# Patient Record
Sex: Male | Born: 1990 | Race: White | Hispanic: No | Marital: Single | Smoking: Never smoker
Health system: Southern US, Community
[De-identification: ages and names within clinical notes are randomized; demographics above are authoritative.]

## PROBLEM LIST (undated history)

## (undated) HISTORY — PX: WISDOM TOOTH EXTRACTION: SHX21

---

## 2013-08-25 ENCOUNTER — Emergency Department: Payer: Self-pay | Admitting: Emergency Medicine

## 2014-07-13 ENCOUNTER — Emergency Department: Payer: Self-pay | Admitting: Emergency Medicine

## 2015-08-10 ENCOUNTER — Emergency Department: Payer: Self-pay

## 2015-08-10 ENCOUNTER — Encounter: Payer: Self-pay | Admitting: Emergency Medicine

## 2015-08-10 ENCOUNTER — Emergency Department
Admission: EM | Admit: 2015-08-10 | Discharge: 2015-08-11 | Disposition: A | Discharge status: Other Acute Inpatient Hospital | Payer: Self-pay | Attending: Emergency Medicine | Admitting: Emergency Medicine

## 2015-08-10 DIAGNOSIS — Y9389 Activity, other specified: Secondary | ICD-10-CM | POA: Insufficient documentation

## 2015-08-10 DIAGNOSIS — S1093XA Contusion of unspecified part of neck, initial encounter: Secondary | ICD-10-CM

## 2015-08-10 DIAGNOSIS — S1083XA Contusion of other specified part of neck, initial encounter: Secondary | ICD-10-CM | POA: Insufficient documentation

## 2015-08-10 DIAGNOSIS — Y9289 Other specified places as the place of occurrence of the external cause: Secondary | ICD-10-CM | POA: Insufficient documentation

## 2015-08-10 DIAGNOSIS — S3992XA Unspecified injury of lower back, initial encounter: Secondary | ICD-10-CM | POA: Insufficient documentation

## 2015-08-10 DIAGNOSIS — S0993XA Unspecified injury of face, initial encounter: Secondary | ICD-10-CM | POA: Insufficient documentation

## 2015-08-10 DIAGNOSIS — S6992XA Unspecified injury of left wrist, hand and finger(s), initial encounter: Secondary | ICD-10-CM | POA: Insufficient documentation

## 2015-08-10 DIAGNOSIS — Y998 Other external cause status: Secondary | ICD-10-CM | POA: Insufficient documentation

## 2015-08-10 LAB — PROTIME-INR
INR: 0.96
PROTHROMBIN TIME: 13 s (ref 11.4–15.0)

## 2015-08-10 LAB — BASIC METABOLIC PANEL
Anion gap: 4 — ABNORMAL LOW (ref 5–15)
BUN: 20 mg/dL (ref 6–20)
CALCIUM: 9.3 mg/dL (ref 8.9–10.3)
CO2: 28 mmol/L (ref 22–32)
Chloride: 106 mmol/L (ref 101–111)
Creatinine, Ser: 0.93 mg/dL (ref 0.61–1.24)
GFR calc Af Amer: >60 mL/min (ref >60)
GLUCOSE: 101 mg/dL — AB (ref 65–99)
Potassium: 4.1 mmol/L (ref 3.5–5.1)
Sodium: 138 mmol/L (ref 135–145)

## 2015-08-10 LAB — CBC WITH DIFFERENTIAL/PLATELET
BASOS ABS: 0.1 10*3/uL (ref 0–0.1)
Basophils Relative: 1 %
Eosinophils Absolute: 0.1 10*3/uL (ref 0–0.7)
Eosinophils Relative: 1 %
HEMATOCRIT: 47.5 % (ref 40.0–52.0)
Hemoglobin: 16.2 g/dL (ref 13.0–18.0)
LYMPHS PCT: 16 %
Lymphs Abs: 2.1 10*3/uL (ref 1.0–3.6)
MCH: 29.5 pg (ref 26.0–34.0)
MCHC: 34.2 g/dL (ref 32.0–36.0)
MCV: 86.4 fL (ref 80.0–100.0)
Monocytes Absolute: 1 10*3/uL (ref 0.2–1.0)
Monocytes Relative: 7 %
NEUTROS ABS: 10.2 10*3/uL — AB (ref 1.4–6.5)
Neutrophils Relative %: 75 %
PLATELETS: 300 10*3/uL (ref 150–440)
RBC: 5.5 MIL/uL (ref 4.40–5.90)
RDW: 12.5 % (ref 11.5–14.5)
WBC: 13.6 10*3/uL — AB (ref 3.8–10.6)

## 2015-08-10 LAB — HEPATIC FUNCTION PANEL
ALT: 34 U/L (ref 17–63)
AST: 25 U/L (ref 15–41)
Albumin: 4.9 g/dL (ref 3.5–5.0)
Alkaline Phosphatase: 78 U/L (ref 38–126)
BILIRUBIN DIRECT: 0.1 mg/dL (ref 0.1–0.5)
BILIRUBIN INDIRECT: 1 mg/dL — AB (ref 0.3–0.9)
BILIRUBIN TOTAL: 1.1 mg/dL (ref 0.3–1.2)
Total Protein: 8.2 g/dL — ABNORMAL HIGH (ref 6.5–8.1)

## 2015-08-10 MED ORDER — IOHEXOL 300 MG/ML  SOLN
100.0000 mL | Freq: Once | INTRAMUSCULAR | Status: AC | PRN
  Administered 2015-08-10: 100 mL via INTRAVENOUS

## 2015-08-10 MED ORDER — MORPHINE SULFATE (PF) 4 MG/ML IV SOLN
4.0000 mg | Freq: Once | INTRAVENOUS | Status: AC
  Administered 2015-08-10: 4 mg via INTRAVENOUS
  Filled 2015-08-10: qty 1

## 2015-08-10 MED ORDER — FENTANYL CITRATE (PF) 100 MCG/2ML IJ SOLN
50.0000 ug | Freq: Once | INTRAMUSCULAR | Status: AC
  Administered 2015-08-10: 50 ug via INTRAVENOUS
  Filled 2015-08-10: qty 2
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
## 2015-08-11 MED ORDER — FENTANYL CITRATE (PF) 100 MCG/2ML IJ SOLN
INTRAMUSCULAR | Status: AC
  Administered 2015-08-11: 50 ug via INTRAVENOUS
  Filled 2015-08-11: qty 2

## 2015-08-11 MED ORDER — FENTANYL CITRATE (PF) 100 MCG/2ML IJ SOLN
50.0000 ug | Freq: Once | INTRAMUSCULAR | Status: AC
  Administered 2015-08-11: 50 ug via INTRAVENOUS
  Filled 2015-08-11: qty 2

## 2015-08-11 NOTE — ED Notes (Signed)
Pt provided with ice chips per verbal order of Dr. Alphonzo LemmingsMcShane.

## 2015-08-16 ENCOUNTER — Emergency Department: Payer: Self-pay

## 2015-08-16 ENCOUNTER — Encounter: Payer: Self-pay | Admitting: *Deleted

## 2015-08-16 ENCOUNTER — Emergency Department
Admission: EM | Admit: 2015-08-16 | Discharge: 2015-08-16 | Disposition: A | Discharge status: Home / Self Care | Payer: Self-pay | Attending: Student | Admitting: Student

## 2015-08-16 DIAGNOSIS — M545 Low back pain: Secondary | ICD-10-CM | POA: Insufficient documentation

## 2015-08-16 DIAGNOSIS — S60212D Contusion of left wrist, subsequent encounter: Secondary | ICD-10-CM | POA: Insufficient documentation

## 2015-08-16 MED ORDER — HYDROCODONE-ACETAMINOPHEN 5-325 MG PO TABS: 1.0000 | ORAL_TABLET | ORAL | Status: DC | PRN

## 2015-08-16 MED ORDER — BACLOFEN 10 MG PO TABS: 10.0000 mg | ORAL_TABLET | Freq: Three times a day (TID) | ORAL | Status: DC

## 2015-08-16 MED ORDER — IBUPROFEN 800 MG PO TABS: 800.0000 mg | ORAL_TABLET | Freq: Three times a day (TID) | ORAL | Status: DC | PRN
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
## 2015-08-16 NOTE — Discharge Instructions (Signed)
Back Pain, Adult °Back pain is very common in adults. The cause of back pain is rarely dangerous and the pain often gets better over time. The cause of your back pain may not be known. Some common causes of back pain include: °· Strain of the muscles or ligaments supporting the spine. °· Wear and tear (degeneration) of the spinal disks. °· Arthritis. °· Direct injury to the back. °For many people, back pain may return. Since back pain is rarely dangerous, most people can learn to manage this condition on their own. °HOME CARE INSTRUCTIONS °Watch your back pain for any changes. The following actions may help to lessen any discomfort you are feeling: °· Remain active. It is stressful on your back to sit or stand in one place for long periods of time. Do not sit, drive, or stand in one place for more than 30 minutes at a time. Take short walks on even surfaces as soon as you are able. Try to increase the length of time you walk each day. °· Exercise regularly as directed by your health care provider. Exercise helps your back heal faster. It also helps avoid future injury by keeping your muscles strong and flexible. °· Do not stay in bed. Resting more than 1-2 days can delay your recovery. °· Pay attention to your body when you bend and lift. The most comfortable positions are those that put less stress on your recovering back. Always use proper lifting techniques, including: °· Bending your knees. °· Keeping the load close to your body. °· Avoiding twisting. °· Find a comfortable position to sleep. Use a firm mattress and lie on your side with your knees slightly bent. If you lie on your back, put a pillow under your knees. °· Avoid feeling anxious or stressed. Stress increases muscle tension and can worsen back pain. It is important to recognize when you are anxious or stressed and learn ways to manage it, such as with exercise. °· Take medicines only as directed by your health care provider. Over-the-counter  medicines to reduce pain and inflammation are often the most helpful. Your health care provider may prescribe muscle relaxant drugs. These medicines help dull your pain so you can more quickly return to your normal activities and healthy exercise. °· Apply ice to the injured area: °· Put ice in a plastic bag. °· Place a towel between your skin and the bag. °· Leave the ice on for 20 minutes, 2-3 times a day for the first 2-3 days. After that, ice and heat may be alternated to reduce pain and spasms. °· Maintain a healthy weight. Excess weight puts extra stress on your back and makes it difficult to maintain good posture. °SEEK MEDICAL CARE IF: °· You have pain that is not relieved with rest or medicine. °· You have increasing pain going down into the legs or buttocks. °· You have pain that does not improve in one week. °· You have night pain. °· You lose weight. °· You have a fever or chills. °SEEK IMMEDIATE MEDICAL CARE IF:  °· You develop new bowel or bladder control problems. °· You have unusual weakness or numbness in your arms or legs. °· You develop nausea or vomiting. °· You develop abdominal pain. °· You feel faint. °  °This information is not intended to replace advice given to you by your health care provider. Make sure you discuss any questions you have with your health care provider. °  °Document Released: 07/22/2005 Document Revised: 08/12/2014 Document Reviewed: 11/23/2013 °Elsevier Interactive Patient Education ©2016 Elsevier   Inc.  Musculoskeletal Pain Musculoskeletal pain is muscle and boney aches and pains. These pains can occur in any part of the body. Your caregiver may treat you without knowing the cause of the pain. They may treat you if blood or urine tests, X-rays, and other tests were normal.  CAUSES There is often not a definite cause or reason for these pains. These pains may be caused by a type of germ (virus). The discomfort may also come from overuse. Overuse includes working out  too hard when your body is not fit. Boney aches also come from weather changes. Bone is sensitive to atmospheric pressure changes. HOME CARE INSTRUCTIONS   Ask when your test results will be ready. Make sure you get your test results.  Only take over-the-counter or prescription medicines for pain, discomfort, or fever as directed by your caregiver. If you were given medications for your condition, do not drive, operate machinery or power tools, or sign legal documents for 24 hours. Do not drink alcohol. Do not take sleeping pills or other medications that may interfere with treatment.  Continue all activities unless the activities cause more pain. When the pain lessens, slowly resume normal activities. Gradually increase the intensity and duration of the activities or exercise.  During periods of severe pain, bed rest may be helpful. Lay or sit in any position that is comfortable.  Putting ice on the injured area.  Put ice in a bag.  Place a towel between your skin and the bag.  Leave the ice on for 15 to 20 minutes, 3 to 4 times a day.  Follow up with your caregiver for continued problems and no reason can be found for the pain. If the pain becomes worse or does not go away, it may be necessary to repeat tests or do additional testing. Your caregiver may need to look further for a possible cause. SEEK IMMEDIATE MEDICAL CARE IF:  You have pain that is getting worse and is not relieved by medications.  You develop chest pain that is associated with shortness or breath, sweating, feeling sick to your stomach (nauseous), or throw up (vomit).  Your pain becomes localized to the abdomen.  You develop any new symptoms that seem different or that concern you. MAKE SURE YOU:   Understand these instructions.  Will watch your condition.  Will get help right away if you are not doing well or get worse.   This information is not intended to replace advice given to you by your health care  provider. Make sure you discuss any questions you have with your health care provider.   Document Released: 07/22/2005 Document Revised: 10/14/2011 Document Reviewed: 03/26/2013 Elsevier Interactive Patient Education 2016 Elsevier Inc.  [employer] Therapy Heat therapy can help ease sore, stiff, injured, and tight muscles and joints. Heat relaxes your muscles, which may help ease your pain.  RISKS AND COMPLICATIONS If you have any of the following conditions, do not use heat therapy unless your health care provider has approved:  Poor circulation.  Healing wounds or scarred skin in the area being treated.  Diabetes, heart disease, or high blood pressure.  Not being able to feel (numbness) the area being treated.  Unusual swelling of the area being treated.  Active infections.  Blood clots.  Cancer.  Inability to communicate pain. This may include young children and people who have problems with their brain function (dementia).  Pregnancy. Heat therapy should only be used on old, pre-existing, or long-lasting (chronic) injuries. Do  not use heat therapy on new injuries unless directed by your health care provider. HOW TO USE HEAT THERAPY There are several different kinds of heat therapy, including:  Moist heat pack.  Warm water bath.  Hot water bottle.  Electric heating pad.  Heated gel pack.  Heated wrap.  Electric heating pad. Use the heat therapy method suggested by your health care provider. Follow your health care provider's instructions on when and how to use heat therapy. GENERAL HEAT THERAPY RECOMMENDATIONS  Do not sleep while using heat therapy. Only use heat therapy while you are awake.  Your skin may turn pink while using heat therapy. Do not use heat therapy if your skin turns red.  Do not use heat therapy if you have new pain.  High heat or long exposure to heat can cause burns. Be careful when using heat therapy to avoid burning your skin.  Do not use  heat therapy on areas of your skin that are already irritated, such as with a rash or sunburn. SEEK MEDICAL CARE IF:  You have blisters, redness, swelling, or numbness.  You have new pain.  Your pain is worse. MAKE SURE YOU:  Understand these instructions.  Will watch your condition.  Will get help right away if you are not doing well or get worse.   This information is not intended to replace advice given to you by your health care provider. Make sure you discuss any questions you have with your health care provider.   Document Released: 10/14/2011 Document Revised: 08/12/2014 Document Reviewed: 09/14/2013 Elsevier Interactive Patient Education [employer] Elsevier Inc.

## 2016-05-20 ENCOUNTER — Encounter: Payer: Self-pay | Admitting: Emergency Medicine

## 2016-05-20 ENCOUNTER — Emergency Department
Admission: EM | Admit: 2016-05-20 | Discharge: 2016-05-20 | Disposition: A | Discharge status: Home / Self Care | Payer: Self-pay | Attending: Emergency Medicine | Admitting: Emergency Medicine

## 2016-05-20 ENCOUNTER — Emergency Department: Payer: Self-pay

## 2016-05-20 DIAGNOSIS — Y999 Unspecified external cause status: Secondary | ICD-10-CM | POA: Insufficient documentation

## 2016-05-20 DIAGNOSIS — F17200 Nicotine dependence, unspecified, uncomplicated: Secondary | ICD-10-CM | POA: Insufficient documentation

## 2016-05-20 DIAGNOSIS — Y92832 Beach as the place of occurrence of the external cause: Secondary | ICD-10-CM | POA: Insufficient documentation

## 2016-05-20 DIAGNOSIS — Y9302 Activity, running: Secondary | ICD-10-CM | POA: Insufficient documentation

## 2016-05-20 DIAGNOSIS — X501XXA Overexertion from prolonged static or awkward postures, initial encounter: Secondary | ICD-10-CM | POA: Insufficient documentation

## 2016-05-20 DIAGNOSIS — S8991XA Unspecified injury of right lower leg, initial encounter: Secondary | ICD-10-CM | POA: Insufficient documentation

## 2016-05-20 MED ORDER — HYDROCODONE-ACETAMINOPHEN 5-325 MG PO TABS: 1.0000 | ORAL_TABLET | ORAL | 0 refills | Status: DC | PRN

## 2016-05-20 MED ORDER — MELOXICAM 15 MG PO TABS
15.0000 mg | ORAL_TABLET | Freq: Every day | ORAL | 0 refills | Status: DC
Start: 2016-05-20 — End: 2018-03-09

## 2016-05-20 NOTE — ED Triage Notes (Signed)
Twisted right knee on Saturday, still having pain, ambulates without difficulty.

## 2016-05-20 NOTE — ED Notes (Signed)
States he was running and twisted right knee and fell  Having pain to anterior knee  Pain radiates to mid calf  No deformity noted
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
## 2018-03-09 ENCOUNTER — Encounter: Payer: Self-pay | Admitting: Emergency Medicine

## 2018-03-09 ENCOUNTER — Emergency Department
Admission: EM | Admit: 2018-03-09 | Discharge: 2018-03-09 | Disposition: A | Discharge status: Home / Self Care | Payer: Self-pay | Attending: Emergency Medicine | Admitting: Emergency Medicine

## 2018-03-09 ENCOUNTER — Other Ambulatory Visit: Payer: Self-pay

## 2018-03-09 DIAGNOSIS — Y929 Unspecified place or not applicable: Secondary | ICD-10-CM | POA: Insufficient documentation

## 2018-03-09 DIAGNOSIS — Y999 Unspecified external cause status: Secondary | ICD-10-CM | POA: Insufficient documentation

## 2018-03-09 DIAGNOSIS — Y9389 Activity, other specified: Secondary | ICD-10-CM | POA: Insufficient documentation

## 2018-03-09 DIAGNOSIS — M5441 Lumbago with sciatica, right side: Secondary | ICD-10-CM | POA: Insufficient documentation

## 2018-03-09 DIAGNOSIS — S66912A Strain of unspecified muscle, fascia and tendon at wrist and hand level, left hand, initial encounter: Secondary | ICD-10-CM | POA: Insufficient documentation

## 2018-03-09 DIAGNOSIS — X500XXA Overexertion from strenuous movement or load, initial encounter: Secondary | ICD-10-CM | POA: Insufficient documentation

## 2018-03-09 MED ORDER — PREDNISONE 10 MG PO TABS: ORAL_TABLET | ORAL | 0 refills | Status: AC

## 2018-03-09 MED ORDER — CYCLOBENZAPRINE HCL 10 MG PO TABS: ORAL_TABLET | ORAL | 0 refills | Status: AC

## 2018-03-09 NOTE — Discharge Instructions (Addendum)
Follow-up with your orthopedist at [facility] if any continued problems.  Begin taking medication as directed.  Prednisone 6 tablets beginning today and tapering down by 1 tablet.  You may use ice or heat to your back as needed for discomfort.
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
## 2018-03-09 NOTE — ED Notes (Signed)
See triage note  Presents with lower back pain  States pain started about 2 weeks ago after moving a piano up the stairs  States pain is mid back and slightly to the right  Also having some discomfort to left wrist d/t same injury  Ambulates with slight limp d/t pain  States he has tried ice/heat and IBU w/o much relief
